# Patient Record
Sex: Male | Born: 1960 | Race: Black or African American | Hispanic: No | Marital: Single | Smoking: Never smoker
Health system: Southern US, Community
[De-identification: ages and names within clinical notes are randomized; demographics above are authoritative.]

## PROBLEM LIST (undated history)

## (undated) DIAGNOSIS — I10 Essential (primary) hypertension: Secondary | ICD-10-CM

---

## 2002-02-18 ENCOUNTER — Emergency Department (HOSPITAL_COMMUNITY): Admission: EM | Admit: 2002-02-18 | Discharge: 2002-02-18 | Payer: Self-pay | Admitting: *Deleted

## 2003-04-01 ENCOUNTER — Emergency Department (HOSPITAL_COMMUNITY): Admission: AD | Admit: 2003-04-01 | Discharge: 2003-04-01 | Payer: Self-pay | Admitting: Family Medicine

## 2003-10-29 ENCOUNTER — Emergency Department (HOSPITAL_COMMUNITY): Admission: EM | Admit: 2003-10-29 | Discharge: 2003-10-29 | Payer: Self-pay | Admitting: Internal Medicine

## 2004-06-07 ENCOUNTER — Emergency Department (HOSPITAL_COMMUNITY): Admission: EM | Admit: 2004-06-07 | Discharge: 2004-06-08 | Payer: Self-pay | Admitting: Emergency Medicine

## 2004-06-20 ENCOUNTER — Emergency Department (HOSPITAL_COMMUNITY): Admission: EM | Admit: 2004-06-20 | Discharge: 2004-06-20 | Payer: Self-pay | Admitting: Emergency Medicine

## 2009-03-30 ENCOUNTER — Emergency Department (HOSPITAL_COMMUNITY): Admission: EM | Admit: 2009-03-30 | Discharge: 2009-03-30 | Payer: Self-pay | Admitting: Family Medicine

## 2009-03-30 ENCOUNTER — Emergency Department (HOSPITAL_COMMUNITY): Admission: EM | Admit: 2009-03-30 | Discharge: 2009-03-31 | Payer: Self-pay | Admitting: Emergency Medicine

## 2010-04-20 ENCOUNTER — Inpatient Hospital Stay (INDEPENDENT_AMBULATORY_CARE_PROVIDER_SITE_OTHER)
Admission: RE | Admit: 2010-04-20 | Discharge: 2010-04-20 | Disposition: A | Discharge status: Home / Self Care | Payer: Self-pay | Source: Ambulatory Visit | Attending: Family Medicine | Admitting: Family Medicine

## 2010-04-20 DIAGNOSIS — I10 Essential (primary) hypertension: Secondary | ICD-10-CM

## 2010-04-20 DIAGNOSIS — E860 Dehydration: Secondary | ICD-10-CM

## 2010-04-20 DIAGNOSIS — K5289 Other specified noninfective gastroenteritis and colitis: Secondary | ICD-10-CM

## 2010-04-20 LAB — POCT I-STAT, CHEM 8
BUN: 23 mg/dL (ref 6–23)
Calcium, Ion: 1.04 mmol/L — ABNORMAL LOW (ref 1.12–1.32)
Chloride: 101 mEq/L (ref 96–112)
Creatinine, Ser: 1.8 mg/dL — ABNORMAL HIGH (ref 0.4–1.5)
Glucose, Bld: 116 mg/dL — ABNORMAL HIGH (ref 70–99)
HCT: 50 % (ref 39.0–52.0)
Hemoglobin: 17 g/dL (ref 13.0–17.0)
Potassium: 3.7 mEq/L (ref 3.5–5.1)
Sodium: 141 mEq/L (ref 135–145)
TCO2: 32 mmol/L (ref 0–100)

## 2010-04-20 LAB — DIFFERENTIAL
Basophils Absolute: 0 10*3/uL (ref 0.0–0.1)
Basophils Relative: 0 % (ref 0–1)
Eosinophils Absolute: 0 10*3/uL (ref 0.0–0.7)
Eosinophils Relative: 0 % (ref 0–5)
Lymphocytes Relative: 7 % — ABNORMAL LOW (ref 12–46)
Lymphs Abs: 0.5 10*3/uL — ABNORMAL LOW (ref 0.7–4.0)
Monocytes Absolute: 0.3 10*3/uL (ref 0.1–1.0)
Monocytes Relative: 4 % (ref 3–12)
Neutro Abs: 6.5 10*3/uL (ref 1.7–7.7)
Neutrophils Relative %: 89 % — ABNORMAL HIGH (ref 43–77)

## 2010-04-20 LAB — CBC
Hemoglobin: 16.2 g/dL (ref 13.0–17.0)
MCH: 28.5 pg (ref 26.0–34.0)
MCHC: 35.4 g/dL (ref 30.0–36.0)
Platelets: 125 10*3/uL — ABNORMAL LOW (ref 150–400)
RDW: 14.8 % (ref 11.5–15.5)

## 2010-05-27 LAB — DIFFERENTIAL
Eosinophils Absolute: 0.1 10*3/uL (ref 0.0–0.7)
Eosinophils Relative: 1 % (ref 0–5)
Lymphs Abs: 2.5 10*3/uL (ref 0.7–4.0)
Monocytes Relative: 7 % (ref 3–12)

## 2010-05-27 LAB — POCT I-STAT, CHEM 8
Creatinine, Ser: 2.1 mg/dL — ABNORMAL HIGH (ref 0.4–1.5)
HCT: 42 % (ref 39.0–52.0)
Hemoglobin: 14.3 g/dL (ref 13.0–17.0)
Sodium: 141 mEq/L (ref 135–145)
TCO2: 30 mmol/L (ref 0–100)

## 2010-05-27 LAB — URINALYSIS, ROUTINE W REFLEX MICROSCOPIC
Protein, ur: NEGATIVE mg/dL
Urobilinogen, UA: 0.2 mg/dL (ref 0.0–1.0)

## 2010-05-27 LAB — BASIC METABOLIC PANEL
Chloride: 101 mEq/L (ref 96–112)
GFR calc Af Amer: 42 mL/min — ABNORMAL LOW (ref >60)
Potassium: 2.9 mEq/L — ABNORMAL LOW (ref 3.5–5.1)

## 2010-05-27 LAB — CBC
HCT: 40.3 % (ref 39.0–52.0)
MCV: 84 fL (ref 78.0–100.0)
RBC: 4.8 MIL/uL (ref 4.22–5.81)
WBC: 6.3 10*3/uL (ref 4.0–10.5)

## 2010-05-27 LAB — URINE MICROSCOPIC-ADD ON

## 2015-05-28 ENCOUNTER — Encounter: Payer: Self-pay | Admitting: Emergency Medicine

## 2015-05-28 ENCOUNTER — Emergency Department
Admission: EM | Admit: 2015-05-28 | Discharge: 2015-05-28 | Disposition: A | Discharge status: Home / Self Care | Payer: Self-pay | Attending: Emergency Medicine | Admitting: Emergency Medicine

## 2015-05-28 ENCOUNTER — Emergency Department: Payer: Self-pay

## 2015-05-28 DIAGNOSIS — S8391XA Sprain of unspecified site of right knee, initial encounter: Secondary | ICD-10-CM

## 2015-05-28 DIAGNOSIS — S838X1A Sprain of other specified parts of right knee, initial encounter: Secondary | ICD-10-CM | POA: Insufficient documentation

## 2015-05-28 DIAGNOSIS — I10 Essential (primary) hypertension: Secondary | ICD-10-CM | POA: Insufficient documentation

## 2015-05-28 DIAGNOSIS — M25461 Effusion, right knee: Secondary | ICD-10-CM | POA: Insufficient documentation

## 2015-05-28 DIAGNOSIS — Y92511 Restaurant or cafe as the place of occurrence of the external cause: Secondary | ICD-10-CM | POA: Insufficient documentation

## 2015-05-28 DIAGNOSIS — Y999 Unspecified external cause status: Secondary | ICD-10-CM | POA: Insufficient documentation

## 2015-05-28 DIAGNOSIS — W010XXA Fall on same level from slipping, tripping and stumbling without subsequent striking against object, initial encounter: Secondary | ICD-10-CM | POA: Insufficient documentation

## 2015-05-28 DIAGNOSIS — Y939 Activity, unspecified: Secondary | ICD-10-CM | POA: Insufficient documentation

## 2015-05-28 HISTORY — DX: Essential (primary) hypertension: I10

## 2015-05-28 MED ORDER — KETOROLAC TROMETHAMINE 30 MG/ML IJ SOLN
30.0000 mg | Freq: Once | INTRAMUSCULAR | Status: AC
  Administered 2015-05-28: 30 mg via INTRAMUSCULAR
  Filled 2015-05-28: qty 1

## 2015-05-28 MED ORDER — ACETAMINOPHEN 500 MG PO TABS: 500.0000 mg | ORAL_TABLET | Freq: Four times a day (QID) | ORAL | Status: AC | PRN

## 2015-05-28 MED ORDER — PREDNISONE 20 MG PO TABS: ORAL_TABLET | ORAL | Status: DC

## 2015-05-28 NOTE — Discharge Instructions (Signed)
Cryotherapy Cryotherapy is when you put ice on your injury. Ice helps lessen pain and puffiness (swelling) after an injury. Ice works the best when you start using it in the first 24 to 48 hours after an injury. HOME CARE  Put a dry or damp towel between the ice pack and your skin.  You may press gently on the ice pack.  Leave the ice on for no more than 10 to 20 minutes at a time.  Check your skin after 5 minutes to make sure your skin is okay.  Rest at least 20 minutes between ice pack uses.  Stop using ice when your skin loses feeling (numbness).  Do not use ice on someone who cannot tell you when it hurts. This includes small children and people with memory problems (dementia). GET HELP RIGHT AWAY IF:  You have white spots on your skin.  Your skin turns blue or pale.  Your skin feels waxy or hard.  Your puffiness gets worse. MAKE SURE YOU:   Understand these instructions.  Will watch your condition.  Will get help right away if you are not doing well or get worse.   This information is not intended to replace advice given to you by your health care provider. Make sure you discuss any questions you have with your health care provider.   Document Released: 08/14/2007 Document Revised: 05/20/2011 Document Reviewed: 10/18/2010 Elsevier Interactive Patient Education 2016 Elsevier Inc.  Knee Effusion Knee effusion means that you have extra fluid in your knee. This can cause pain. Your knee may be more difficult to bend and move. HOME CARE  Use crutches as told by your doctor.  Wear a knee brace as told by your doctor.  Apply ice to the swollen area:  Put ice in a plastic bag.  Place a towel between your skin and the bag.  Leave the ice on for 20 minutes, 2-3 times per day.  Keep your knee raised (elevated) when you are sitting or lying down.  Take medicines only as told by your doctor.  Do any rehabilitation or strengthening exercises as told by your  doctor.  Rest your knee as told by your doctor. You may start doing your normal activities again when your doctor says it is okay.  Keep all follow-up visits as told by your doctor. This is important. GET HELP IF:   You continue to have pain in your knee. GET HELP RIGHT AWAY IF:  You have increased swelling or redness of your knee.  You have severe pain in your knee.  You have a fever.   This information is not intended to replace advice given to you by your health care provider. Make sure you discuss any questions you have with your health care provider.   Document Released: 03/30/2010 Document Revised: 03/18/2014 Document Reviewed: 10/11/2013 Elsevier Interactive Patient Education 2016 Elsevier Inc.  Knee Sprain A knee sprain is a tear in the strong bands of tissue that connect the bones (ligaments) of your knee. HOME CARE  Raise (elevate) your injured knee to lessen puffiness (swelling).  To ease pain and puffiness, put ice on the injured area.  Put ice in a plastic bag.  Place a towel between your skin and the bag.  Leave the ice on for 20 minutes, 2-3 times a day.  Only take medicine as told by your doctor.  Do not leave your knee unprotected until pain and stiffness go away (usually 4-6 weeks).  If you have a cast  or splint, do not get it wet. If your doctor told you to not take it off, cover it with a plastic bag when you shower or bathe. Do not swim.  Your doctor may have you do exercises to prevent or limit permanent weakness and stiffness. GET HELP RIGHT AWAY IF:   Your cast or splint becomes damaged.  Your pain gets worse.  You have a lot of pain, puffiness, or numbness below the cast or splint. MAKE SURE YOU:   Understand these instructions.  Will watch your condition.  Will get help right away if you are not doing well or get worse.   This information is not intended to replace advice given to you by your health care provider. Make sure you  discuss any questions you have with your health care provider.   Document Released: 02/13/2009 Document Revised: 03/02/2013 Document Reviewed: 11/03/2012 Elsevier Interactive Patient Education [employer] Elsevier Inc.

## 2015-05-28 NOTE — ED Provider Notes (Signed)
[facility] Emergency Department Provider Note  ____________________________________________  Time seen: Approximately 11:58 AM  I have reviewed the triage vital signs and the nursing notes.   HISTORY  Chief Complaint Knee Pain    HPI Tom Rogers is a 55 y.o. male , NAD, presents to the emergency department with 2 week history of right knee pain. He states that he slipped and fell in a restaurant one month ago. Did not have pain in his body until the knee pain began 2 weeks ago. Has medial knee pain that is worse with bending the knee. Does ease off once he stands and is ambulatory. Pain has continually increased over the last 2 weeks noting that now the pain is a 10 out of 10. Denies any numbness, weakness, tingling. Has had no swelling about the lower extremity and notes no edema. Denies chest pain, shortness breath, headache, visual changes. Has no lacerations, bruising or other skin sores to the area.   Past Medical History  Diagnosis Date  . Hypertension     There are no active problems to display for this patient.   History reviewed. No pertinent past surgical history.  Current Outpatient Rx  Name  Route  Sig  Dispense  Refill  . acetaminophen (TYLENOL) 500 MG tablet   Oral   Take 1 tablet (500 mg total) by mouth every 6 (six) hours as needed.   30 tablet   0   . predniSONE (DELTASONE) 20 MG tablet      Take 2 tablets by mouth, once daily, for 5 days   10 tablet   0     Allergies Review of patient's allergies indicates no known allergies.  No family history on file.  Social History Social History  Substance Use Topics  . Smoking status: Never Smoker   . Smokeless tobacco: None  . Alcohol Use: No     Review of Systems  Constitutional: No fever/chills Eyes: No visual changes.  Cardiovascular: No chest pain. Respiratory:  No shortness of breath. No wheezing.  Musculoskeletal: Positive right knee pain. Negative for hip,  ankle, foot pain Skin: Positive right knee swelling. Negative for rash, lacerations, redness, warmth. Neurological: Negative for headaches, focal weakness or numbness. 10-point ROS otherwise negative.  ____________________________________________   PHYSICAL EXAM:  VITAL SIGNS: ED Triage Vitals  Enc Vitals Group     BP 05/28/15 1052 174/119 mmHg     Pulse Rate 05/28/15 1052 83     Resp 05/28/15 1052 18     Temp 05/28/15 1052 98.6 F (37 C)     Temp Source 05/28/15 1052 Oral     SpO2 05/28/15 1052 97 %     Weight 05/28/15 1052 225 lb (102.059 kg)     Height 05/28/15 1052 6\' 1"  (1.854 m)     Head Cir --      Peak Flow --      Pain Score 05/28/15 1052 9     Pain Loc --      Pain Edu? --      Excl. in GC? --     Constitutional: Alert and oriented. Well appearing and in no acute distress. Eyes: Conjunctivae are normal.  Head: Atraumatic. Neck: Supple with full range of motion. Cardiovascular:  Good peripheral circulation with right lower extremity having 2+ pulses. Respiratory: Normal respiratory effort without tachypnea or retractions. Lungs CTAB. Gastrointestinal: Soft and nontender. No distention. No CVA tenderness. Musculoskeletal: Pain to palpation about the medial right knee. No pain to  manipulation or palpation of the patella. No laxity to anterior posterior drawer nor varus or valgus stress. No swelling or effusion to palpation. No lower extremity edema. Neurologic:  Normal speech and language. No gross focal neurologic deficits are appreciated.  Skin:  Skin is warm, dry and intact. No rash, redness, warmth, swelling noted. Psychiatric: Mood and affect are normal. Speech and behavior are normal. Patient exhibits appropriate insight and judgement.   ____________________________________________   LABS  None  ____________________________________________  EKG  None ____________________________________________  RADIOLOGY I have personally viewed and evaluated  these images (plain radiographs) as part of my medical decision making, as well as reviewing the written report by the radiologist.  Dg Knee Complete 4 Views Right  05/28/2015  CLINICAL DATA:  Pt states he slipped and fell one month ago and two weeks later started having pain to right knee. Pain Has worsened. Pain with weight-bearing. EXAM: RIGHT KNEE - COMPLETE 4+ VIEW COMPARISON:  None. FINDINGS: Moderate joint effusion is present. There is mild degenerative change involving the patellofemoral and medial compartments. No evidence for acute fracture or subluxation. IMPRESSION: 1. Joint effusion. 2. Degenerative changes. Electronically Signed   By: Norva Pavlov M.D.   On: 05/28/2015 12:43    ____________________________________________    PROCEDURES  Procedure(s) performed: None    Medications  ketorolac (TORADOL) 30 MG/ML injection 30 mg (30 mg Intramuscular Given 05/28/15 1252)     ____________________________________________   INITIAL IMPRESSION / ASSESSMENT AND PLAN / ED COURSE  Pertinent imaging results that were available during my care of the patient were reviewed by me and considered in my medical decision making (see chart for details).  Patient's diagnosis is consistent with right knee sprain with effusion. The patient's right knee placed in an Ace wrap and given crutches to ambulate. Patient will be discharged home with prescriptions for prednisone to take as directed. May take Tylenol as needed for pain. Patient is to follow up with Dr. Joice Lofts in orthopedics if not improving in 2-3 days. Patient is given ED precautions to return to the ED for any worsening or new symptoms.    ____________________________________________  FINAL CLINICAL IMPRESSION(S) / ED DIAGNOSES  Final diagnoses:  Right knee sprain, initial encounter  Knee effusion, right      NEW MEDICATIONS STARTED DURING THIS VISIT:  New Prescriptions   ACETAMINOPHEN (TYLENOL) 500 MG TABLET    Take 1  tablet (500 mg total) by mouth every 6 (six) hours as needed.   PREDNISONE (DELTASONE) 20 MG TABLET    Take 2 tablets by mouth, once daily, for 5 days         [name], PA-C 05/28/15 1255  Jene Every, MD 05/28/15 1447

## 2015-05-28 NOTE — ED Notes (Signed)
Pt states he slipped and fell one month ago and two weeks later started having pain to right knee that is getting worse.

## 2017-01-14 ENCOUNTER — Emergency Department (HOSPITAL_COMMUNITY): Payer: Self-pay

## 2017-01-14 ENCOUNTER — Encounter (HOSPITAL_COMMUNITY): Payer: Self-pay | Admitting: Emergency Medicine

## 2017-01-14 ENCOUNTER — Emergency Department (HOSPITAL_COMMUNITY)
Admission: EM | Admit: 2017-01-14 | Discharge: 2017-01-14 | Disposition: A | Discharge status: Home / Self Care | Payer: Self-pay | Attending: Emergency Medicine | Admitting: Emergency Medicine

## 2017-01-14 ENCOUNTER — Other Ambulatory Visit: Payer: Self-pay

## 2017-01-14 DIAGNOSIS — I10 Essential (primary) hypertension: Secondary | ICD-10-CM | POA: Insufficient documentation

## 2017-01-14 DIAGNOSIS — Z79899 Other long term (current) drug therapy: Secondary | ICD-10-CM | POA: Insufficient documentation

## 2017-01-14 DIAGNOSIS — R600 Localized edema: Secondary | ICD-10-CM | POA: Insufficient documentation

## 2017-01-14 DIAGNOSIS — M10272 Drug-induced gout, left ankle and foot: Secondary | ICD-10-CM | POA: Insufficient documentation

## 2017-01-14 MED ORDER — PREDNISONE 20 MG PO TABS
60.0000 mg | ORAL_TABLET | Freq: Once | ORAL | Status: AC
  Administered 2017-01-14: 60 mg via ORAL
  Filled 2017-01-14: qty 3

## 2017-01-14 MED ORDER — OXYCODONE-ACETAMINOPHEN 5-325 MG PO TABS: 2.0000 | ORAL_TABLET | Freq: Four times a day (QID) | ORAL | 0 refills | Status: AC | PRN

## 2017-01-14 MED ORDER — PREDNISONE 10 MG PO TABS: 40.0000 mg | ORAL_TABLET | Freq: Every day | ORAL | 0 refills | Status: AC

## 2017-01-14 MED ORDER — OXYCODONE-ACETAMINOPHEN 5-325 MG PO TABS
1.0000 | ORAL_TABLET | Freq: Once | ORAL | Status: AC
  Administered 2017-01-14: 1 via ORAL
  Filled 2017-01-14: qty 1

## 2017-01-14 MED ORDER — ACETAMINOPHEN 325 MG PO TABS
650.0000 mg | ORAL_TABLET | Freq: Once | ORAL | Status: AC
  Administered 2017-01-14: 650 mg via ORAL
  Filled 2017-01-14: qty 2

## 2017-01-14 NOTE — ED Provider Notes (Signed)
[facility]   CSN: [id number] Arrival date & time: 01/14/17  0847     History   Chief Complaint Chief Complaint  Patient presents with  . Foot Pain  . Gout    HPI Tom SangerObinna Quesnell is a 56 y.o. male with history of nonischemic cardiomyopathy, hypertension, on Lasix presents to ED for sudden onset, gradually worsening pain, redness, warmth to the left big toe MTP. Onset 2-3 days ago. Tylenol has not provided any pain relief. Pain is aggravated by weightbearing and direct palpation, minimal pain with range of motion. No trauma or history of previous surgeries. Denies history of diabetes or gout. No IV drug use.  HPI  Past Medical History:  Diagnosis Date  . Hypertension     There are no active problems to display for this patient.   History reviewed. No pertinent surgical history.     Home Medications    Prior to Admission medications   Medication Sig Start Date End Date Taking? Authorizing Provider  acetaminophen (TYLENOL) 500 MG tablet Take 1 tablet (500 mg total) by mouth every 6 (six) hours as needed. 05/28/15   Hagler, Jami L, PA-C  oxyCODONE-acetaminophen (PERCOCET/ROXICET) 5-325 MG tablet Take 2 tablets every 6 (six) hours as needed for up to 2 days by mouth for severe pain. 01/14/17 01/16/17  Liberty HandyGibbons, Jahmai Finelli J, PA-C  predniSONE (DELTASONE) 10 MG tablet Take 4 tablets (40 mg total) daily for 5 days by mouth. 01/14/17 01/19/17  Liberty HandyGibbons, Xue Low J, PA-C    Family History No family history on file.  Social History Social History   Tobacco Use  . Smoking status: Never Smoker  Substance Use Topics  . Alcohol use: No  . Drug use: No     Allergies   Patient has no known allergies.   Review of Systems Review of Systems  Constitutional: Negative for chills and fever.  Musculoskeletal: Positive for arthralgias and gait problem.  Skin: Negative for color change.     Physical Exam Updated Vital Signs BP  (!) 156/106 (BP Location: Right Arm)   Pulse 67   Temp 98 F (36.7 C) (Oral)   Resp 16   SpO2 100%   Physical Exam  Constitutional: He is oriented to person, place, and time. He appears well-developed and well-nourished. No distress.  NAD.  HENT:  Head: Normocephalic and atraumatic.  Right Ear: External ear normal.  Left Ear: External ear normal.  Nose: Nose normal.  Eyes: Conjunctivae and EOM are normal. No scleral icterus.  Neck: Normal range of motion. Neck supple.  Cardiovascular: Normal rate, regular rhythm, normal heart sounds and intact distal pulses.  No murmur heard. Pulmonary/Chest: Effort normal and breath sounds normal. He has no wheezes.  Musculoskeletal: Normal range of motion. He exhibits edema and tenderness. He exhibits no deformity.  Focal edema, erythema and warmth to medial aspect of left big toe MTP, mild pain with PROM and AROM.  Neurological: He is alert and oriented to person, place, and time.  Skin: Skin is warm and dry. Capillary refill takes less than 2 seconds.  Psychiatric: He has a normal mood and affect. His behavior is normal. Judgment and thought content normal.  Nursing note and vitals reviewed.    ED Treatments / Results  Labs (all labs ordered are listed, but only abnormal results are displayed) Labs Reviewed - No data to display  EKG  EKG Interpretation None       Radiology Dg Foot Complete Left  Result Date: 01/14/2017 CLINICAL DATA:  Five days of pain along the plantar surface of the great toe. Symptoms increase with pressure. No known injury. EXAM: LEFT FOOT - COMPLETE 3+ VIEW COMPARISON:  Left foot series of October 29, 2003 FINDINGS: The bones are subjectively adequately mineralized. There is a stable cortical defect along the distal medial aspect of the proximal phalanx of the great toe. The interphalangeal and first MTP joints are normal. The soft tissues on the lateral view are unremarkable. The other phalanges as well as the  metatarsals are intact. The sesamoid bones of the first MT PE joint region are stable in appearance. The intertarsal and tarsometatarsal joints appear normal. There is an Achilles region calcaneal spur. IMPRESSION: There is no acute bony abnormality of the left great toe. The overlying soft tissues are unremarkable as well. Electronically Signed   By: David  SwazilandJordan M.D.   On: 01/14/2017 10:08    Procedures Procedures (including critical care time)  Medications Ordered in ED Medications  oxyCODONE-acetaminophen (PERCOCET/ROXICET) 5-325 MG per tablet 1 tablet (1 tablet Oral Given 01/14/17 1225)  acetaminophen (TYLENOL) tablet 650 mg (650 mg Oral Given 01/14/17 1225)  predniSONE (DELTASONE) tablet 60 mg (60 mg Oral Given 01/14/17 1225)     Initial Impression / Assessment and Plan / ED Course  I have reviewed the triage vital signs and the nursing notes.  Pertinent labs & imaging results that were available during my care of the patient were reviewed by me and considered in my medical decision making (see chart for details).    Pt presents with monoarticular swelling and pain.  He is on lasix. Pt is afebrile. No preceding trauma. No IVDU. Triage x-ray negative. No previous trauma, no h/o DM, no fever.  Pt dc with tylenol and prednisone. Discussed that pt should respond to treatment with in 24 hour of begining treatment & likely resolve in 2-3 days. Patient gout diet info   Final Clinical Impressions(s) / ED Diagnoses   Final diagnoses:  Acute drug-induced gout involving toe of left foot    ED Discharge Orders        Ordered    predniSONE (DELTASONE) 10 MG tablet  Daily     01/14/17 1232    oxyCODONE-acetaminophen (PERCOCET/ROXICET) 5-325 MG tablet  Every 6 hours PRN     01/14/17 1232       Liberty HandyGibbons, Salvador Bigbee J, PA-C 01/14/17 1344    Melene PlanFloyd, Dan, DO 01/14/17 1430

## 2017-01-14 NOTE — Discharge Instructions (Signed)
Your x-ray was normal.   Given your history of lasix use and exam, I suspect your toe pain is from gout. Please read attached information on gout and low purine diet.   For mild/moderate and tolerable pain take 1000 mg tylenol every 8 hours.  Additionally, take prednisone as prescribed. If pain is severe, take percocet as prescribed.   Percocet is a narcotic pain medication. Some mild side effects are nausea, drowsiness, stomach discomfort and constipation.  You should take a stool softener and/or miralax if you take any narcotic/opioid medications to avoid constipation. Other side effects include risk of overdose, death, dependence and abuse. Do not consume alcohol, drive or use heavy machinery while taking this medication. Do not leave unattended around children. Take only for severe o break through pain not controlled with tylenol or ibuprofen. Flush any extra pills that are left over or that you do not use. The emergency department has a strict policy regarding prescription of narcotic medications. We are unable to refill this medication in the emergency department for chronic pain. Contact your primary care provider or specialist for chronic pain management and refill on narcotic medications.

## 2017-06-06 ENCOUNTER — Other Ambulatory Visit: Payer: Self-pay

## 2017-06-06 ENCOUNTER — Emergency Department (HOSPITAL_COMMUNITY)
Admission: EM | Admit: 2017-06-06 | Discharge: 2017-06-06 | Disposition: A | Discharge status: Home / Self Care | Payer: Self-pay | Attending: Emergency Medicine | Admitting: Emergency Medicine

## 2017-06-06 ENCOUNTER — Emergency Department (HOSPITAL_COMMUNITY): Payer: Self-pay

## 2017-06-06 ENCOUNTER — Encounter (HOSPITAL_COMMUNITY): Payer: Self-pay | Admitting: *Deleted

## 2017-06-06 DIAGNOSIS — M25572 Pain in left ankle and joints of left foot: Secondary | ICD-10-CM | POA: Insufficient documentation

## 2017-06-06 DIAGNOSIS — I10 Essential (primary) hypertension: Secondary | ICD-10-CM | POA: Insufficient documentation

## 2017-06-06 DIAGNOSIS — Y929 Unspecified place or not applicable: Secondary | ICD-10-CM | POA: Insufficient documentation

## 2017-06-06 DIAGNOSIS — Y9301 Activity, walking, marching and hiking: Secondary | ICD-10-CM | POA: Insufficient documentation

## 2017-06-06 DIAGNOSIS — Y999 Unspecified external cause status: Secondary | ICD-10-CM | POA: Insufficient documentation

## 2017-06-06 DIAGNOSIS — X509XXA Other and unspecified overexertion or strenuous movements or postures, initial encounter: Secondary | ICD-10-CM | POA: Insufficient documentation

## 2017-06-06 MED ORDER — HYDROCODONE-ACETAMINOPHEN 5-325 MG PO TABS
1.0000 | ORAL_TABLET | Freq: Once | ORAL | Status: AC
  Administered 2017-06-06: 1 via ORAL
  Filled 2017-06-06: qty 1

## 2017-06-06 NOTE — Progress Notes (Signed)
Orthopedic Tech Progress Note Patient Details:  Fatima SangerObinna Sudduth 11/01/1960 [id number]  Ortho Devices Type of Ortho Device: Ankle Air splint, Ace wrap Ortho Device/Splint Location: Left Ortho Device/Splint Interventions: Application, Adjustment   Post Interventions Patient Tolerated: Well, Ambulated well Instructions Provided: Adjustment of device, Care of device   Alvina ChouWilliams, Breckin Savannah C 06/06/2017, 9:24 PM

## 2017-06-06 NOTE — ED Notes (Signed)
Ortho paged. 

## 2017-06-06 NOTE — Discharge Instructions (Addendum)
Please take Ibuprofen (Advil, motrin) and Tylenol (acetaminophen) to relieve your pain.  You may take up to 600 MG (3 pills) of normal strength ibuprofen every 8 hours as needed.  In between doses of ibuprofen you make take tylenol, up to 1,000 mg (two extra strength pills).  Do not take more than 3,000 mg tylenol in a 24 hour period.  Please check all medication labels as many medications such as pain and cold medications may contain tylenol.  Do not drink alcohol while taking these medications.  Do not take other NSAID'S while taking ibuprofen (such as aleve or naproxen).  Please take ibuprofen with food to decrease stomach upset.  Today you received medications that may make you sleepy or impair your ability to make decisions.  For the next 24 hours please do not drive, operate heavy machinery, care for a small child with out another adult present, or perform any activities that may cause harm to you or someone else if you were to fall asleep or be impaired.   If you develop redness, fevers, chills, pain higher up in your leg or have any concerns please seek additional medical care and evaluation.

## 2017-06-06 NOTE — ED Triage Notes (Signed)
Pt in c/o L ankle pain, no swelling present, denies injury, pt ambulatory, denies calf swelling or pain, pt reports pain onset yesterday, A&O x4

## 2017-06-06 NOTE — ED Provider Notes (Signed)
[facility]   CSN: [id number] Arrival date & time: 06/06/17  1803     History   Chief Complaint Chief Complaint  Patient presents with  . Ankle Pain    HPI Tom Rogers is a 57 y.o. male who presents today for evaluation of left ankle pain.  He reports that yesterday when he was walking he remembers stepping down abnormally.  He reports mild swelling and pain over the medial and lateral sides.  He denies any fevers or chills.  He denies personal history of gout.    HPI  Past Medical History:  Diagnosis Date  . Hypertension     There are no active problems to display for this patient.   History reviewed. No pertinent surgical history.      Home Medications    Prior to Admission medications   Medication Sig Start Date End Date Taking? Authorizing Provider  acetaminophen (TYLENOL) 500 MG tablet Take 1 tablet (500 mg total) by mouth every 6 (six) hours as needed. 05/28/15   Hagler, Jami L, PA-C    Family History No family history on file.  Social History Social History   Tobacco Use  . Smoking status: Never Smoker  . Smokeless tobacco: Never Used  Substance Use Topics  . Alcohol use: No  . Drug use: No     Allergies   Patient has no known allergies.   Review of Systems Review of Systems  Constitutional: Negative for activity change, chills and fever.  HENT: Negative for congestion.   Musculoskeletal: Positive for arthralgias and joint swelling.  Skin: Negative for rash and wound.     Physical Exam Updated Vital Signs BP (!) 148/105 (BP Location: Right Arm)   Pulse 68   Temp 98.9 F (37.2 C) (Oral)   Resp 18   SpO2 100%   Physical Exam  Constitutional: He is oriented to person, place, and time. He appears well-developed and well-nourished.  HENT:  Head: Normocephalic and atraumatic.  Cardiovascular:  2+ DP/PT pulses.  Bilateral feet are warm and well perfused.  Musculoskeletal:  LLE:  Mild swelling over medial and lateral ankle.  Minimal bony tenderness.  Ankle is grossly stable to my examination.  No obvious crepitus or deformities.  Ankle is not red.  5/5 strength, minimal pain with range of motion.  Neurological: He is alert and oriented to person, place, and time.  Skin: Skin is warm and dry. He is not diaphoretic.  No obvious skin breaks or wounds over bilateral feet.  No redness or drainage on the L. Ankle.  Left ankle is not hot.    Nursing note and vitals reviewed.    ED Treatments / Results  Labs (all labs ordered are listed, but only abnormal results are displayed) Labs Reviewed - No data to display  EKG None  Radiology Dg Ankle Complete Left  Result Date: 06/06/2017 CLINICAL DATA:  Left ankle pain.  No swelling. EXAM: LEFT ANKLE COMPLETE - 3+ VIEW COMPARISON:  None. FINDINGS: There is no evidence of fracture, dislocation, or joint effusion. There is no evidence of arthropathy or other focal bone abnormality. There is mild soft tissue swelling around the ankle. IMPRESSION: No acute osseous injury of the left ankle. Electronically Signed   By: Elige KoHetal  Patel   On: 06/06/2017 19:26    Procedures Procedures (including critical care time)  Medications Ordered in ED Medications  HYDROcodone-acetaminophen (NORCO/VICODIN) 5-325 MG per tablet 1 tablet (1 tablet Oral Given 06/06/17 2054)  Initial Impression / Assessment and Plan / ED Course  I have reviewed the triage vital signs and the nursing notes.  Pertinent labs & imaging results that were available during my care of the patient were reviewed by me and considered in my medical decision making (see chart for details).    Tom Rogers Presents with left ankle pain after stepping down "funny" on his left leg consistent with an ankle sprain/strain.  The affected ankle has mild edema and is tender on the medial and lateral aspect aspect.  X-rays were obtained with out acute abnormality. The skin is intact  to ankle/foot.  The foot is warm and well perfused with intact sensation.  Minimal pain with passive ROM, the ankle itself is not warm, red, I do not suspect septic arthritis.  Patient given instructions for OTC pain medication, was offered crutches and declined.  He was given ace wrap and brace.  Patient advised to follow up with PCP if symptoms persist for longer than one week.  Patient was given the option to ask questions, all of which were answered to the best of my ability.  Patient is agreeable for discharge.    Final Clinical Impressions(s) / ED Diagnoses   Final diagnoses:  Acute left ankle pain    ED Discharge Orders    None       Norman Clay 06/07/17 2008    Floyd, Dan, DO 06/10/17 1505

## 2018-11-28 IMAGING — CR DG ANKLE COMPLETE 3+V*L*
3 series · 3 of 3 positions shown · non-contrast
Comparison: None.

CLINICAL DATA: Left ankle pain.  No swelling.

EXAM:
LEFT ANKLE COMPLETE - 3+ VIEW

[ankle ap]
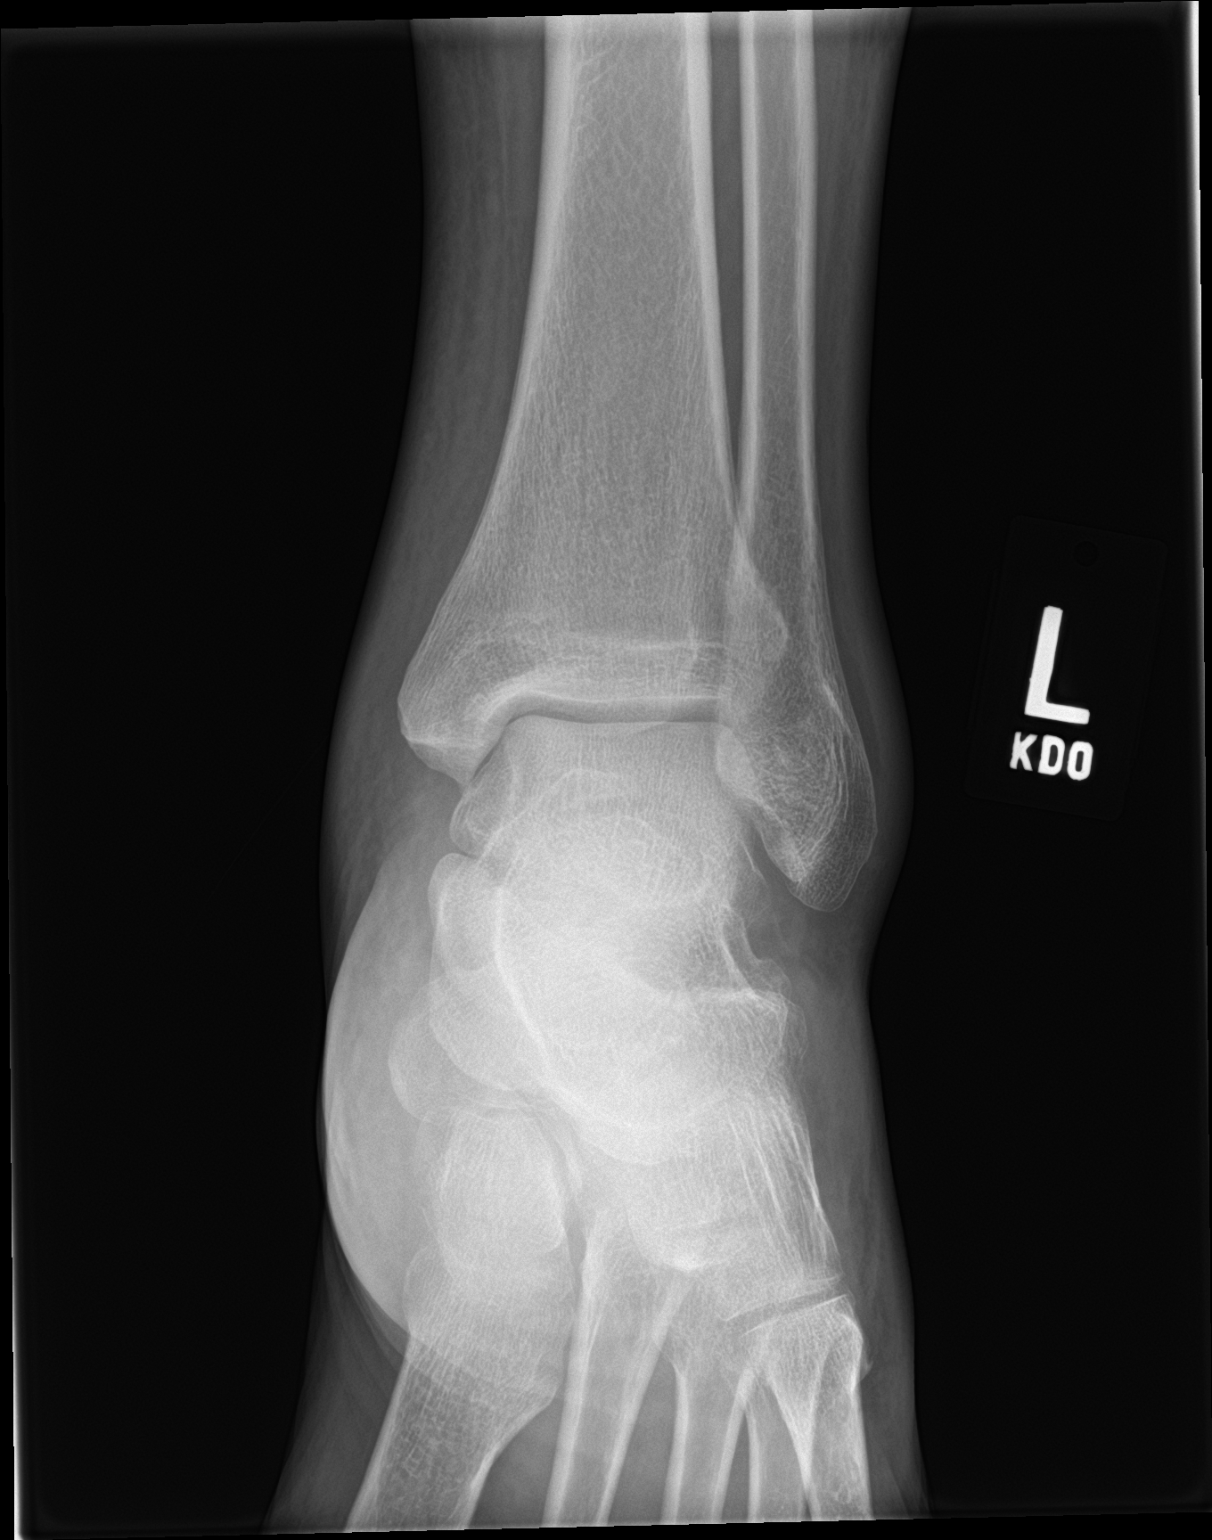

[ankle obl]
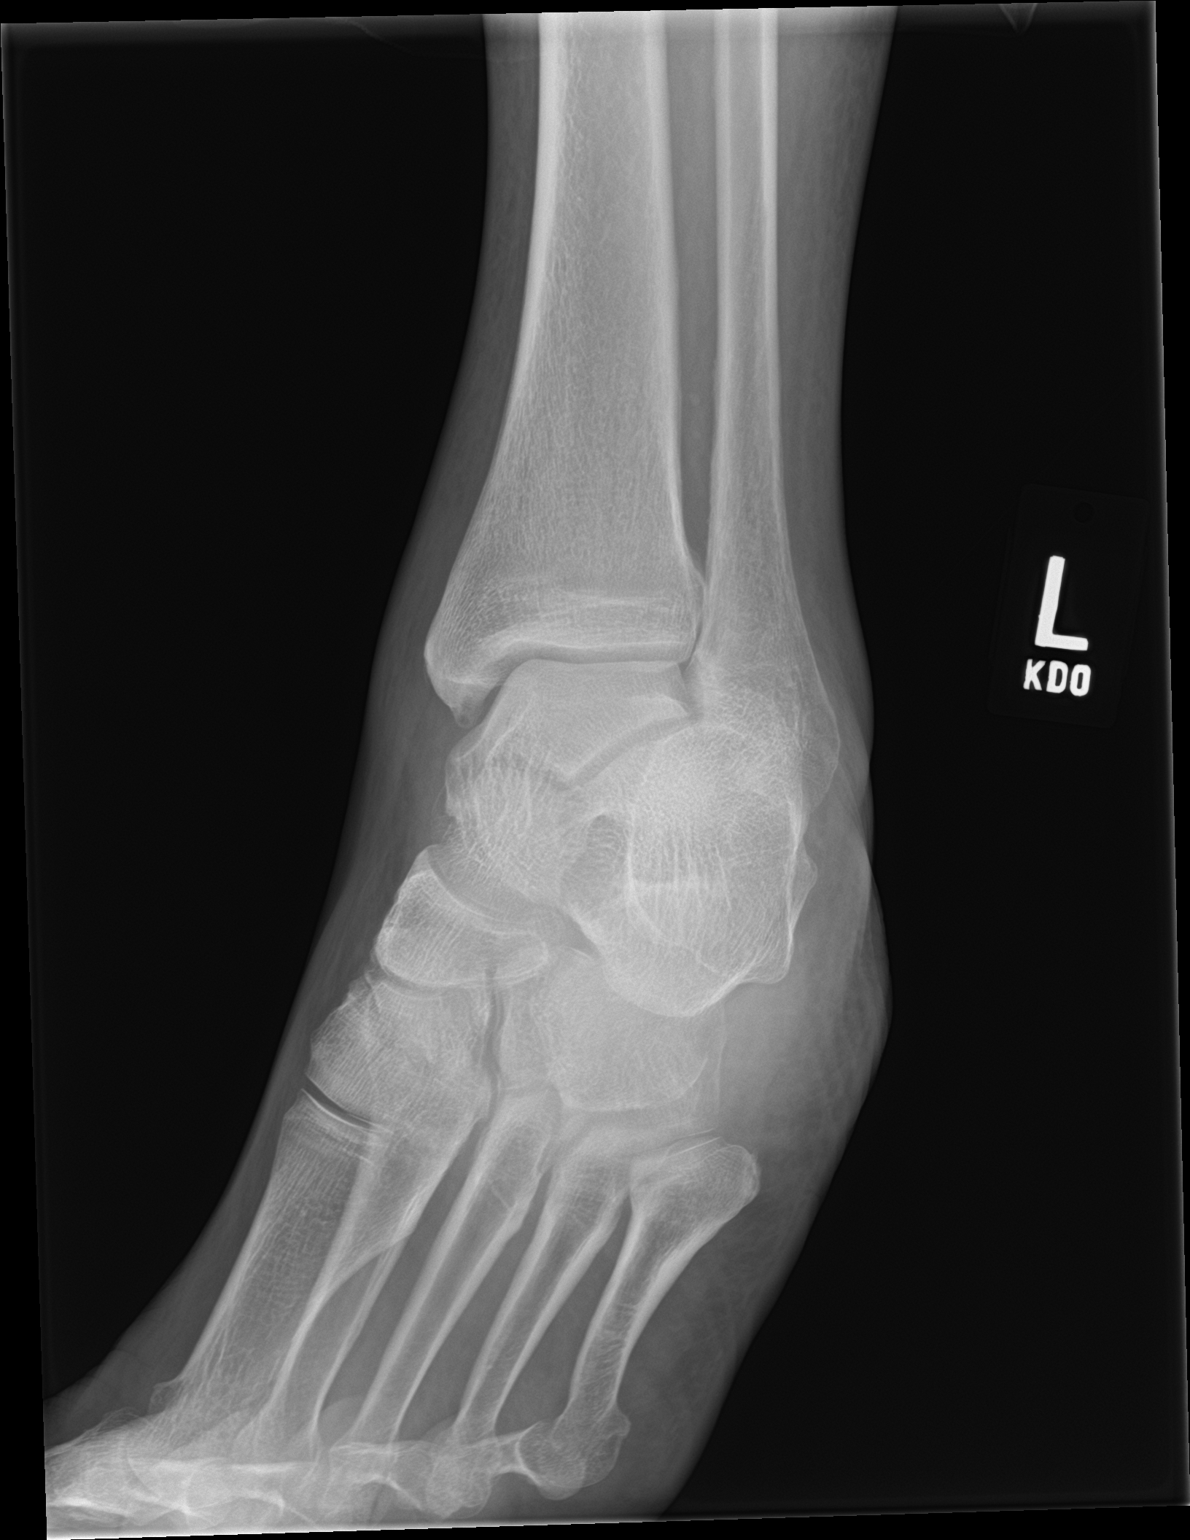

[ankle lat]
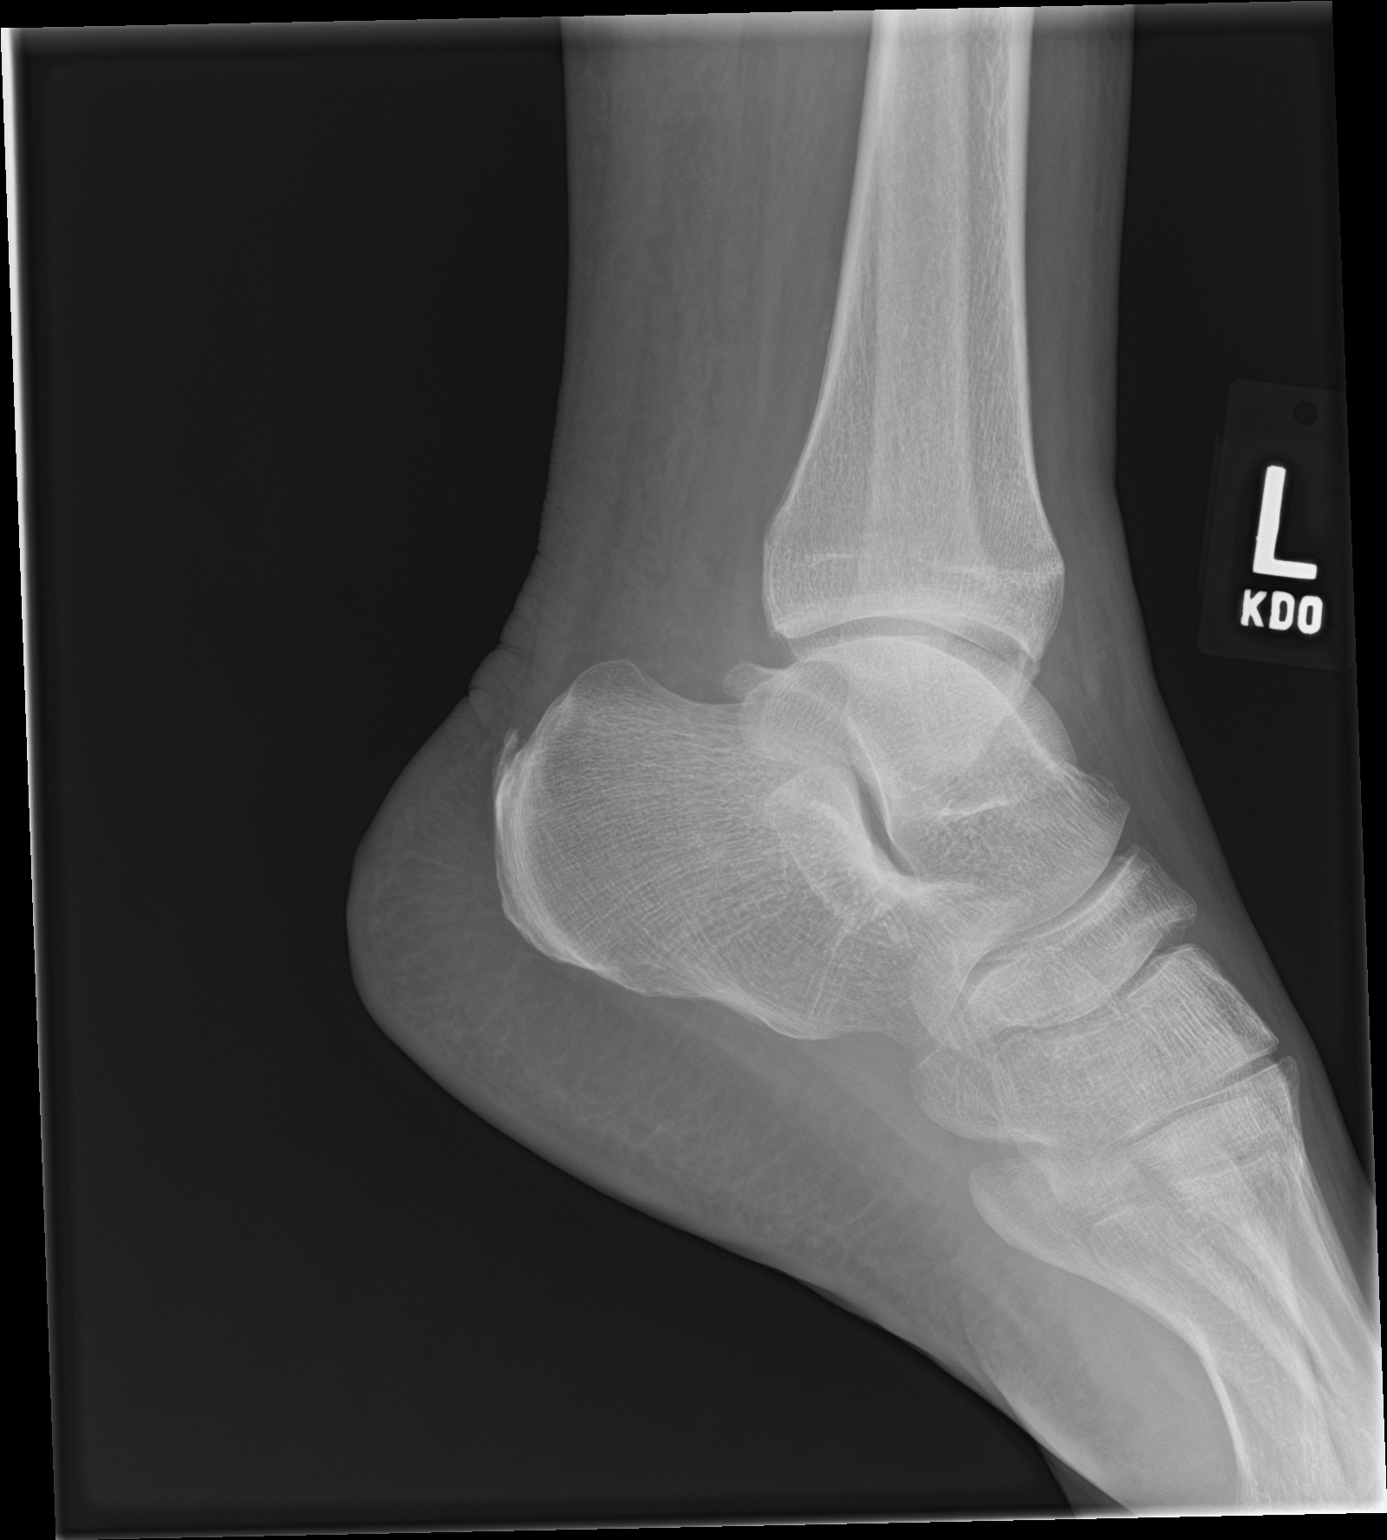

[3 of 3 positions shown; findings below may reference images not displayed]

FINDINGS: There is no evidence of fracture, dislocation, or joint effusion.
There is no evidence of arthropathy or other focal bone abnormality.
There is mild soft tissue swelling around the ankle.
IMPRESSION: No acute osseous injury of the left ankle.

## 2020-03-14 ENCOUNTER — Other Ambulatory Visit: Payer: Self-pay

## 2020-03-14 ENCOUNTER — Emergency Department (HOSPITAL_COMMUNITY): Payer: Self-pay

## 2020-03-14 ENCOUNTER — Emergency Department (HOSPITAL_COMMUNITY)
Admission: EM | Admit: 2020-03-14 | Discharge: 2020-03-14 | Disposition: A | Discharge status: Home / Self Care | Payer: Self-pay | Attending: Emergency Medicine | Admitting: Emergency Medicine

## 2020-03-14 DIAGNOSIS — M10072 Idiopathic gout, left ankle and foot: Secondary | ICD-10-CM | POA: Insufficient documentation

## 2020-03-14 DIAGNOSIS — I10 Essential (primary) hypertension: Secondary | ICD-10-CM | POA: Insufficient documentation

## 2020-03-14 DIAGNOSIS — M109 Gout, unspecified: Secondary | ICD-10-CM

## 2020-03-14 MED ORDER — INDOMETHACIN 25 MG PO CAPS
50.0000 mg | ORAL_CAPSULE | Freq: Once | ORAL | Status: AC
  Administered 2020-03-14: 50 mg via ORAL
  Filled 2020-03-14 (×2): qty 2

## 2020-03-14 MED ORDER — INDOMETHACIN 50 MG PO CAPS: 50.0000 mg | ORAL_CAPSULE | Freq: Three times a day (TID) | ORAL | 0 refills | Status: AC

## 2020-03-14 NOTE — ED Triage Notes (Signed)
C/o left ankle swelling + pain when bearing weight, since Friday. Denies injury.

## 2020-03-14 NOTE — ED Notes (Signed)
DC instructions reviewed with patient and patient verbalized understanding. Patient dc with wheelchair d/t pain in foot. Patient bp elevated, pt stated to he has not taken his blood pressure medication today but would take them when he got home.

## 2020-03-14 NOTE — Discharge Instructions (Signed)
You came to the emergency department today to have your left ankle pain evaluated.  The x-ray that was performed showed no fracture dislocation.  With your history of gout and your physical exam likely that gout is causing your current pain and swelling.  I have given you a dose of indomethacin which is a NSAID medication while you are in the emergency department.  I have also given you a prescription for this medication may take this medication 3 times a day with meals.  Please read the attached paperwork for more information on indomethacin.    You may also take tylenol, up to 1,000 mg (two extra strength pills) every 8 hours as needed.  It is safe to take ibuprofen and tylenol at the same time as they work differently.   Do not take more than 3,000 mg tylenol in a 24 hour period (not more than one dose every 8 hours.  Please check all medication labels as many medications such as pain and cold medications may contain tylenol.  Do not drink alcohol while taking these medications.  Do not take other NSAID'S while taking ibuprofen (such as aleve or naproxen).  Please take ibuprofen with food to decrease stomach upset.   Please follow-up with your primary care provider for further management of gout and if your symptoms do not improve.    Please return to the emergency department if:  Have severe or uncontrolled pain. Cannot urinate.

## 2020-03-14 NOTE — ED Provider Notes (Signed)
[facility] EMERGENCY DEPARTMENT Provider Note   CSN: [id number] Arrival date & time: 03/14/20  1139     History Chief Complaint  Patient presents with  . Joint Swelling    Tom Rogers is a 60 y.o. male with a history of hypertension and gout per patient.  Patient presents with a chief complaint of left ankle pain and swelling.  He reports his symptoms began on Friday and have gotten progressively worse since then.  Patient reports pain is worse with touch or weightbearing and with either he rates his pain as a 10/10.  When patient is off his feet he denies any pain.  Patient reports the pain is similar to pain he has experienced with gout in his right foot.  Patient denies any injury.    Patient denies any chest pain, shortness of breath, hemoptysis, cough, fevers, chills.    Marland Kitchen    HPI     Past Medical History:  Diagnosis Date  . Hypertension     There are no problems to display for this patient.   No past surgical history on file.     No family history on file.  Social History   Tobacco Use  . Smoking status: Never Smoker  . Smokeless tobacco: Never Used  Substance Use Topics  . Alcohol use: No  . Drug use: No    Home Medications Prior to Admission medications   Medication Sig Start Date End Date Taking? Authorizing Provider  indomethacin (INDOCIN) 50 MG capsule Take 1 capsule (50 mg total) by mouth 3 (three) times daily with meals for 3 days. 03/14/20 03/17/20 Yes Jahnavi Muratore, Rose Phi, PA-C  acetaminophen (TYLENOL) 500 MG tablet Take 1 tablet (500 mg total) by mouth every 6 (six) hours as needed. 05/28/15   Hagler, Jami L, PA-C    Allergies    Patient has no known allergies.  Review of Systems   Review of Systems  Constitutional: Negative for chills and fever.  Respiratory: Negative for cough and shortness of breath.   Cardiovascular: Negative for chest pain and palpitations.  Musculoskeletal: Positive for arthralgias (left ankle) and  joint swelling.    Physical Exam Updated Vital Signs BP (S) (!) 142/98 Comment: patient stated he hasnt taken blood pressure medication today and stated will take when he gets home.  Pulse 79   Temp 97.7 F (36.5 C) (Oral)   Resp 16   Ht 6\' 1"  (1.854 m)   Wt 106.1 kg   SpO2 100%   BMI 30.87 kg/m   Physical Exam Vitals and nursing note reviewed.  Constitutional:      General: He is not in acute distress.    Appearance: He is not ill-appearing, toxic-appearing or diaphoretic.  HENT:     Head: Normocephalic.  Eyes:     General: No scleral icterus.       Right eye: No discharge.        Left eye: No discharge.  Cardiovascular:     Rate and Rhythm: Normal rate.     Pulses:          Dorsalis pedis pulses are 3+ on the right side and 3+ on the left side.       Posterior tibial pulses are 3+ on the right side and 3+ on the left side.  Pulmonary:     Effort: Pulmonary effort is normal.  Musculoskeletal:     Right lower leg: No swelling, deformity, tenderness or bony tenderness. No edema.  Left lower leg: No swelling, deformity, tenderness or bony tenderness. No edema.     Right ankle: No swelling, deformity, ecchymosis or lacerations. No tenderness. Normal range of motion.     Right Achilles Tendon: No tenderness or defects.     Left ankle: Swelling present. No deformity, ecchymosis or lacerations. Tenderness present over the medial malleolus. Normal range of motion.     Left Achilles Tendon: No tenderness or defects.     Right foot: Normal capillary refill. No swelling, deformity, tenderness or bony tenderness. Normal pulse.     Left foot: Normal range of motion and normal capillary refill. Swelling present. No deformity, tenderness or bony tenderness. Normal pulse.     Comments: Swelling confined to left ankle and left foot, tenderness to medial malleolus, erythema observed to left medial ankle  Right calf circumference: 39.4cm Left calf circumference: 39.3cm  Skin:     General: Skin is warm and dry.  Neurological:     General: No focal deficit present.     Mental Status: He is alert.  Psychiatric:        Behavior: Behavior is cooperative.     ED Results / Procedures / Treatments   Labs (all labs ordered are listed, but only abnormal results are displayed) Labs Reviewed - No data to display  EKG None  Radiology DG Ankle 2 Views Left  Result Date: 03/14/2020 CLINICAL DATA:  Pain and swelling, no known injury, initial encounter EXAM: LEFT ANKLE - 2 VIEW COMPARISON:  None. FINDINGS: Mild generalized swelling is noted about the ankle joint. Small calcaneal spurs are seen. No acute fracture or dislocation is IMPRESSION: Mild soft tissue swelling without acute bony abnormality. Electronically Signed   By: Alcide Clever M.D.   On: 03/14/2020 12:52    Procedures Procedures (including critical care time)  Medications Ordered in ED Medications  indomethacin (INDOCIN) capsule 50 mg (50 mg Oral Given 03/14/20 2027)    ED Course  I have reviewed the triage vital signs and the nursing notes.  Pertinent labs & imaging results that were available during my care of the patient were reviewed by me and considered in my medical decision making (see chart for details).    MDM Rules/Calculators/A&P                          Alert 60 year old male in no acute distress, nontoxic appearing.  Presents with chief complaint of left ankle swelling and pain.  Patient denies any injury.  Swelling was observed to left ankle and left foot, edema was noted to medial left ankle, tenderness to medial left ankle.  No swelling or tenderness observed to patient's calves bilaterally; denies any active cancer treatment, no recent surgery or immobilization, no history of DVT or PE, no shortness of breath of breath, hemoptysis or tachycardia.  Less concerning for DVT.  Patient reports pain is similar to previous gout flare he has had in right foot.  Patient given indomethacin prescription  and told to follow-up with his primary care provider for gout management.  Discussed results, findings, treatment and follow up. Patient advised of return precautions. Patient verbalized understanding and agreed with plan.    Final Clinical Impression(s) / ED Diagnoses Final diagnoses:  Acute gout of left ankle, unspecified cause    Rx / DC Orders ED Discharge Orders         Ordered    indomethacin (INDOCIN) 50 MG capsule  3 times daily with meals  03/14/20 1955           Loni Beckwith, PA-C 03/14/20 2052    Blanchie Dessert, MD 03/15/20 [phone]

## 2020-03-17 ENCOUNTER — Other Ambulatory Visit: Payer: Self-pay

## 2020-03-17 ENCOUNTER — Encounter (HOSPITAL_COMMUNITY): Payer: Self-pay

## 2020-03-17 ENCOUNTER — Ambulatory Visit (HOSPITAL_COMMUNITY)
Admission: EM | Admit: 2020-03-17 | Discharge: 2020-03-17 | Disposition: A | Discharge status: Home / Self Care | Payer: Self-pay

## 2020-03-17 DIAGNOSIS — Z8739 Personal history of other diseases of the musculoskeletal system and connective tissue: Secondary | ICD-10-CM

## 2020-03-17 DIAGNOSIS — I509 Heart failure, unspecified: Secondary | ICD-10-CM

## 2020-03-17 DIAGNOSIS — M25472 Effusion, left ankle: Secondary | ICD-10-CM

## 2020-03-17 MED ORDER — PREDNISONE 10 MG PO TABS: 30.0000 mg | ORAL_TABLET | Freq: Every day | ORAL | 0 refills | Status: DC

## 2020-03-17 NOTE — ED Triage Notes (Signed)
Pt in with c/o left ankle swelling that has been going on since Friday.  Was seen in ED on 1/4. Xray of ankle showed tissue swelling and acute bone abnormality.  Pt has been taking prescribed indomethacin with no relief states the pain has not changed and is worse

## 2020-03-17 NOTE — Discharge Instructions (Signed)
Please stop taking indomethacin. We will use a lower dose of prednisone of 30mg  once daily. Please make sure you keep your appointment with your heart doctor on Monday. If you develop chest pain, shortness of breath then please report to the emergency room.

## 2020-03-17 NOTE — ED Provider Notes (Signed)
[facility]   MRN: [id number] DOB: Nov 17, 1960  Subjective:   Tom Rogers is a 60 y.o. male presenting for 1 week history of persistent and worsening left ankle swelling.  Has a history of gout and thinks that this is what it is.  Has previously had an right foot and ankle.  Reports that he responded very well to oxycodone and prednisone course.  Patient has had an echocardiogram done at the beginning of December 2021, showed an ejection fraction of 15 to 20%.  Has another follow-up appointment with his cardiologist in 3 days on Monday.  He was last seen at the ER 3 days ago, had a negative x-ray.  He was started on indomethacin which has not helped, swelling is not improved.  Denies history of kidney disease, any active chest pain, shortness of breath.  No current facility-administered medications for this encounter.  Current Outpatient Medications:  .  furosemide (LASIX) 40 MG tablet, Take 1 tablet by mouth daily., Disp: , Rfl:  .  lisinopril (ZESTRIL) 20 MG tablet, Take 1 tablet by mouth daily., Disp: , Rfl:  .  spironolactone (ALDACTONE) 25 MG tablet, Take by mouth., Disp: , Rfl:  .  acetaminophen (TYLENOL) 500 MG tablet, Take 1 tablet (500 mg total) by mouth every 6 (six) hours as needed., Disp: 30 tablet, Rfl: 0 .  amLODipine (NORVASC) 10 MG tablet, Take 10 mg by mouth daily., Disp: , Rfl:  .  carvedilol (COREG) 25 MG tablet, Take 25 mg by mouth 2 (two) times daily., Disp: , Rfl:  .  indomethacin (INDOCIN) 50 MG capsule, Take 1 capsule (50 mg total) by mouth 3 (three) times daily with meals for 3 days., Disp: 9 capsule, Rfl: 0   No Known Allergies  Past Medical History:  Diagnosis Date  . Hypertension      History reviewed. No pertinent surgical history.  History reviewed. No pertinent family history.  Social History   Tobacco Use  . Smoking status: Never Smoker  . Smokeless tobacco: Never Used  Substance Use Topics  . Alcohol use: No  . Drug use: No     ROS   Objective:   Vitals: BP 131/87   Pulse 88   Temp 97.8 F (36.6 C)   Resp 20   SpO2 100%   Physical Exam Constitutional:      General: He is not in acute distress.    Appearance: Normal appearance. He is well-developed. He is not ill-appearing, toxic-appearing or diaphoretic.  HENT:     Head: Normocephalic and atraumatic.     Right Ear: External ear normal.     Left Ear: External ear normal.     Nose: Nose normal.     Mouth/Throat:     Mouth: Mucous membranes are moist.     Pharynx: Oropharynx is clear.  Eyes:     General: No scleral icterus.    Extraocular Movements: Extraocular movements intact.     Pupils: Pupils are equal, round, and reactive to light.  Cardiovascular:     Rate and Rhythm: Normal rate and regular rhythm.     Heart sounds: Normal heart sounds. No murmur heard. No friction rub. No gallop.   Pulmonary:     Effort: Pulmonary effort is normal. No respiratory distress.     Breath sounds: Normal breath sounds. No stridor. No wheezing, rhonchi or rales.  Musculoskeletal:     Left ankle: Swelling present. No deformity, ecchymosis or lacerations. Tenderness present over the lateral  malleolus and medial malleolus. Decreased range of motion.     Left Achilles Tendon: No tenderness or defects. Thompson's test negative.     Left foot: Normal range of motion and normal capillary refill. No swelling, deformity, tenderness, bony tenderness or crepitus.  Neurological:     Mental Status: He is alert and oriented to person, place, and time.  Psychiatric:        Mood and Affect: Mood normal.        Behavior: Behavior normal.        Thought Content: Thought content normal.      Assessment and Plan :   PDMP not reviewed this encounter.  1. Left ankle swelling   2. Congestive heart failure, unspecified HF chronicity, unspecified heart failure type (HCC)   3. History of gout     Patient is being weaned off of amlodipine.  He still takes Lasix.  As a  diuretic, I counseled that it is possible at this is one of the sources of his monoarticular joint swelling and pain.  Discussed that given his heart failure it would be risky to use prednisone but patient really wants to try this.  He contracts for safety and reports that if he has any chest pain, shortness of breath he will report to the emergency room immediately.  Recommend he stop NSAID use.  Maintain follow-up appointment on Monday. Counseled patient on potential for adverse effects with medications prescribed/recommended today, ER and return-to-clinic precautions discussed, patient verbalized understanding.    Wallis Bamberg, [place] 03/17/20 [phone]

## 2020-03-26 ENCOUNTER — Other Ambulatory Visit: Payer: Self-pay

## 2020-03-26 ENCOUNTER — Encounter (HOSPITAL_COMMUNITY): Payer: Self-pay | Admitting: Obstetrics and Gynecology

## 2020-03-26 ENCOUNTER — Emergency Department (HOSPITAL_COMMUNITY)
Admission: EM | Admit: 2020-03-26 | Discharge: 2020-03-26 | Disposition: A | Discharge status: Home / Self Care | Payer: Self-pay | Attending: Emergency Medicine | Admitting: Emergency Medicine

## 2020-03-26 DIAGNOSIS — M109 Gout, unspecified: Secondary | ICD-10-CM | POA: Insufficient documentation

## 2020-03-26 DIAGNOSIS — I10 Essential (primary) hypertension: Secondary | ICD-10-CM | POA: Insufficient documentation

## 2020-03-26 DIAGNOSIS — Z79899 Other long term (current) drug therapy: Secondary | ICD-10-CM | POA: Insufficient documentation

## 2020-03-26 MED ORDER — OXYCODONE-ACETAMINOPHEN 5-325 MG PO TABS: 1.0000 | ORAL_TABLET | Freq: Four times a day (QID) | ORAL | 0 refills | Status: AC | PRN

## 2020-03-26 MED ORDER — PREDNISONE 20 MG PO TABS
60.0000 mg | ORAL_TABLET | Freq: Once | ORAL | Status: AC
  Administered 2020-03-26: 60 mg via ORAL
  Filled 2020-03-26: qty 3

## 2020-03-26 MED ORDER — PREDNISONE 10 MG PO TABS: ORAL_TABLET | ORAL | 0 refills | Status: AC

## 2020-03-26 MED ORDER — OXYCODONE-ACETAMINOPHEN 5-325 MG PO TABS
1.0000 | ORAL_TABLET | Freq: Once | ORAL | Status: AC
  Administered 2020-03-26: 1 via ORAL
  Filled 2020-03-26: qty 1

## 2020-03-26 NOTE — ED Triage Notes (Signed)
Patient presents to the ER for ankle pain. Patient was seen for same on 1/4 and 1/7. Patient reportedly was taking indomethacin and reports the pain is worsening. Reports inability to put pressure on ankle. Obvious swelling noted to area.

## 2020-03-26 NOTE — Progress Notes (Signed)
Orthopedic Tech Progress Note Patient Details:  Tom Rogers 07/28/1960 [id number]  Ortho Devices Ortho Device/Splint Location: applied cam walker to LLE Ortho Device/Splint Interventions: Ordered,Application   Post Interventions Patient Tolerated: Well Instructions Provided: Care of device   Jennye Moccasin 03/26/2020, 1:13 PM

## 2020-03-26 NOTE — ED Provider Notes (Signed)
[facility]-EMERGENCY DEPT Provider Note   CSN: [id number] Arrival date & time: 03/26/20  1053     History Chief Complaint  Patient presents with  . Ankle Pain    Tom Rogers is a 60 y.o. male.  HPI He reports pain in his posterior heel, left, with secondary swelling of his ankle, for 3 weeks.  He has been treated first with indomethacin, then prednisone with partial relief mostly with the prednisone.  He has run out of the prednisone, and now the swelling and pain has worsened.  He has history of same when he had gout, several years ago.  He is not taking medications to suppress gout.  He denies fever, chills, nausea, vomiting.  He came here by private vehicle for evaluation.  There are no other known modifying factors.    Past Medical History:  Diagnosis Date  . Hypertension     There are no problems to display for this patient.   History reviewed. No pertinent surgical history.     No family history on file.  Social History   Tobacco Use  . Smoking status: Never Smoker  . Smokeless tobacco: Never Used  Substance Use Topics  . Alcohol use: No  . Drug use: No    Home Medications Prior to Admission medications   Medication Sig Start Date End Date Taking? Authorizing Provider  oxyCODONE-acetaminophen (PERCOCET) 5-325 MG tablet Take 1 tablet by mouth every 6 (six) hours as needed for moderate pain or severe pain. 03/26/20  Yes Mancel Bale, MD  predniSONE (DELTASONE) 10 MG tablet Take q day 6,5,4,3,2,1 03/26/20  Yes Mancel Bale, MD  acetaminophen (TYLENOL) 500 MG tablet Take 1 tablet (500 mg total) by mouth every 6 (six) hours as needed. 05/28/15   Hagler, Jami L, PA-C  amLODipine (NORVASC) 10 MG tablet Take 10 mg by mouth daily. 02/01/20   [provider]  carvedilol (COREG) 25 MG tablet Take 25 mg by mouth 2 (two) times daily. 02/04/20   [provider]  furosemide (LASIX) 40 MG tablet Take 1 tablet by mouth daily.  02/01/20   [provider]  lisinopril (ZESTRIL) 20 MG tablet Take 1 tablet by mouth daily. 02/06/20   [provider]  spironolactone (ALDACTONE) 25 MG tablet Take by mouth. 02/09/20   [provider]    Allergies    Patient has no known allergies.  Review of Systems   Review of Systems  All other systems reviewed and are negative.   Physical Exam Updated Vital Signs BP (!) 147/89 (BP Location: Left Arm)   Pulse 94   Temp 97.6 F (36.4 C) (Oral)   Resp 16   SpO2 98%   Physical Exam Vitals and nursing note reviewed.  Constitutional:      Appearance: He is well-developed and well-nourished.  HENT:     Head: Normocephalic and atraumatic.     Right Ear: External ear normal.     Left Ear: External ear normal.  Eyes:     Extraocular Movements: EOM normal.     Conjunctiva/sclera: Conjunctivae normal.     Pupils: Pupils are equal, round, and reactive to light.  Neck:     Trachea: Phonation normal.  Cardiovascular:     Rate and Rhythm: Normal rate.  Pulmonary:     Effort: Pulmonary effort is normal.  Chest:     Chest wall: No bony tenderness.  Abdominal:     General: There is no distension.  Musculoskeletal:  Cervical back: Normal range of motion and neck supple.     Comments: Left ankle swollen but not tender.  Tenderness left posterior os calcis region, without deformity or abnormal Achilles tendon function.  No overlying erythema about the ankle or heel regions.  Skin:    General: Skin is warm, dry and intact.  Neurological:     Mental Status: He is alert and oriented to person, place, and time.     Cranial Nerves: No cranial nerve deficit.     Sensory: No sensory deficit.     Motor: No abnormal muscle tone.     Coordination: Coordination normal.  Psychiatric:        Mood and Affect: Mood and affect and mood normal.        Behavior: Behavior normal.        Thought Content: Thought content normal.        Judgment: Judgment normal.      ED Results / Procedures / Treatments   Labs (all labs ordered are listed, but only abnormal results are displayed) Labs Reviewed - No data to display  EKG None  Radiology No results found.  Procedures Procedures (including critical care time)  Medications Ordered in ED Medications  oxyCODONE-acetaminophen (PERCOCET/ROXICET) 5-325 MG per tablet 1 tablet (has no administration in time range)  predniSONE (DELTASONE) tablet 60 mg (has no administration in time range)    ED Course  I have reviewed the triage vital signs and the nursing notes.  Pertinent labs & imaging results that were available during my care of the patient were reviewed by me and considered in my medical decision making (see chart for details).    MDM Rules/Calculators/A&P                           Patient Vitals for the past 24 hrs:  BP Temp Temp src Pulse Resp SpO2  03/26/20 1102 (!) 147/89 97.6 F (36.4 C) Oral 94 16 98 %    12:49 PM Reevaluation with update and discussion. After initial assessment and treatment, an updated evaluation reveals no change in status, findings discussed and questions answered. Mancel Bale   Medical Decision Making:  This patient is presenting for evaluation of pain in the left heel and swelling left ankle, which does require a range of treatment options, and is a complaint that involves a moderate risk of morbidity and mortality. The differential diagnoses include gout, arthropthy, tendinitis. I decided to review old records, and in summary middle-aged male presenting with ongoing pain and swelling left foot/ankle.  I did not require additional historical information from anyone.    Critical Interventions-clinical evaluation, review of prior evaluations including imaging, discussion with patient  After These Interventions, the Patient was reevaluated and was found stable for discharge.  Patient's pain is mostly left heel with nontender swelling in the ankle.   Exact etiology is not clear.  No good clinical evidence for tendinitis and doubt fracture.  Will give additional treatment with prednisone and pain medicine along with splinting the foot and ankle joints using a cam walker.  He will be referred to podiatry for further evaluation and treatment  CRITICAL CARE-no Performed by: Mancel Bale  Nursing Notes Reviewed/ Care Coordinated Applicable Imaging Reviewed Interpretation of Laboratory Data incorporated into ED treatment  The patient appears reasonably screened and/or stabilized for discharge and I doubt any other medical condition or other [facility] requiring further screening, evaluation, or treatment in the ED  at this time prior to discharge.  Plan: Home Medications-continue usual; Home Treatments-CAM Walker for comfort; return here if the recommended treatment, does not improve the symptoms; Recommended follow up-podiatry follow-up for further evaluation and treatment     Final Clinical Impression(s) / ED Diagnoses Final diagnoses:  Gout, unspecified cause, unspecified chronicity, unspecified site    Rx / DC Orders ED Discharge Orders         Ordered    oxyCODONE-acetaminophen (PERCOCET) 5-325 MG tablet  Every 6 hours PRN        03/26/20 1246    predniSONE (DELTASONE) 10 MG tablet        03/26/20 1246           Mancel Bale, MD 03/26/20 1249

## 2020-03-26 NOTE — Discharge Instructions (Signed)
You most likely have gout causing your symptoms.  We are giving you a special boot to walk and to help your discomfort.  We sent prescriptions to your pharmacy.  Call the podiatry office for a follow-up appointment to be seen for further care and treatment as soon as possible.  Do not drive or work when taking the narcotic pain medicine.

## 2021-09-05 IMAGING — DX DG ANKLE 2V *L*
2 series · 2 of 2 positions shown · non-contrast
Comparison: None.

CLINICAL DATA: Pain and swelling, no known injury, initial
encounter

EXAM:
LEFT ANKLE - 2 VIEW

[ankle ap]
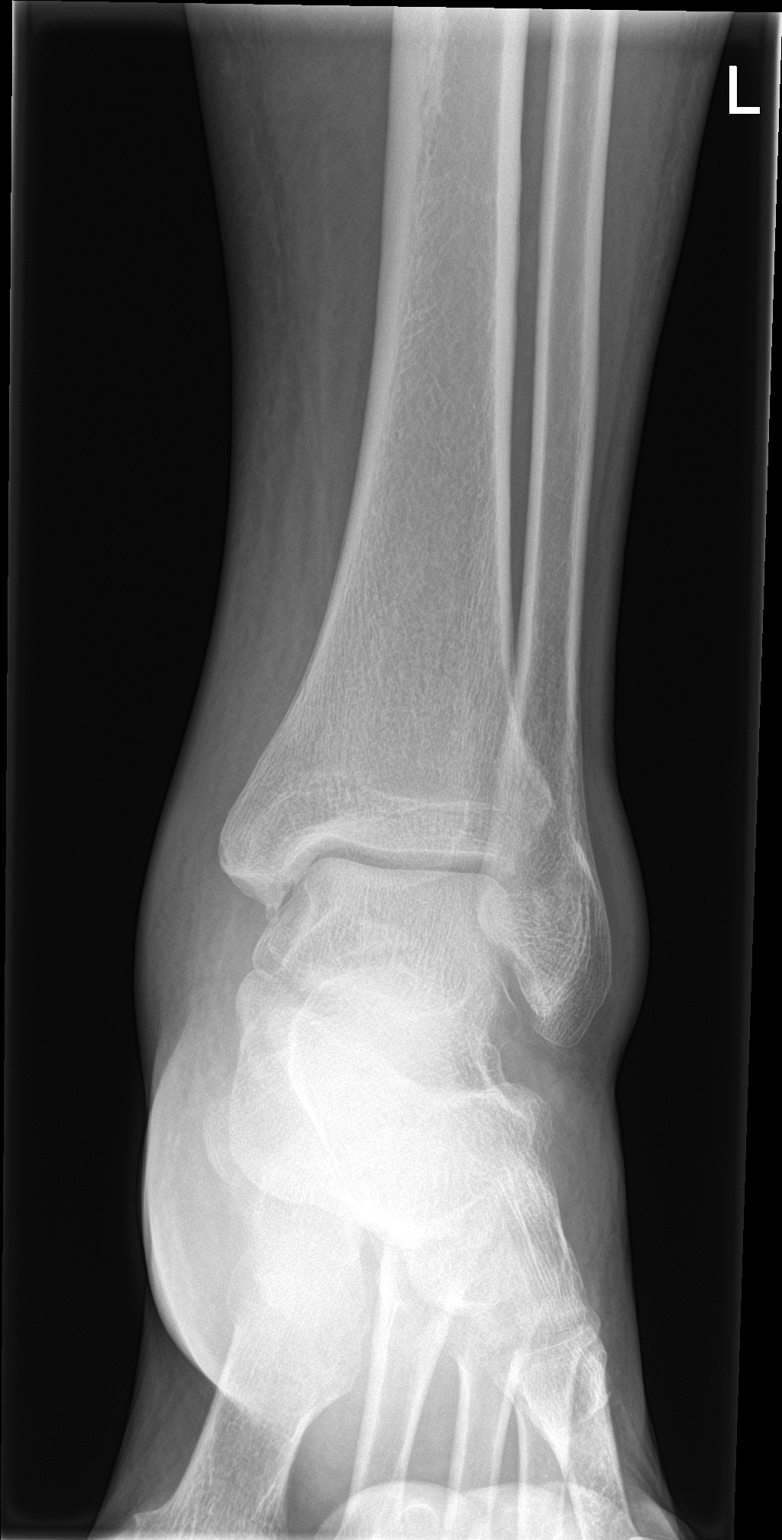

[ankle lat]
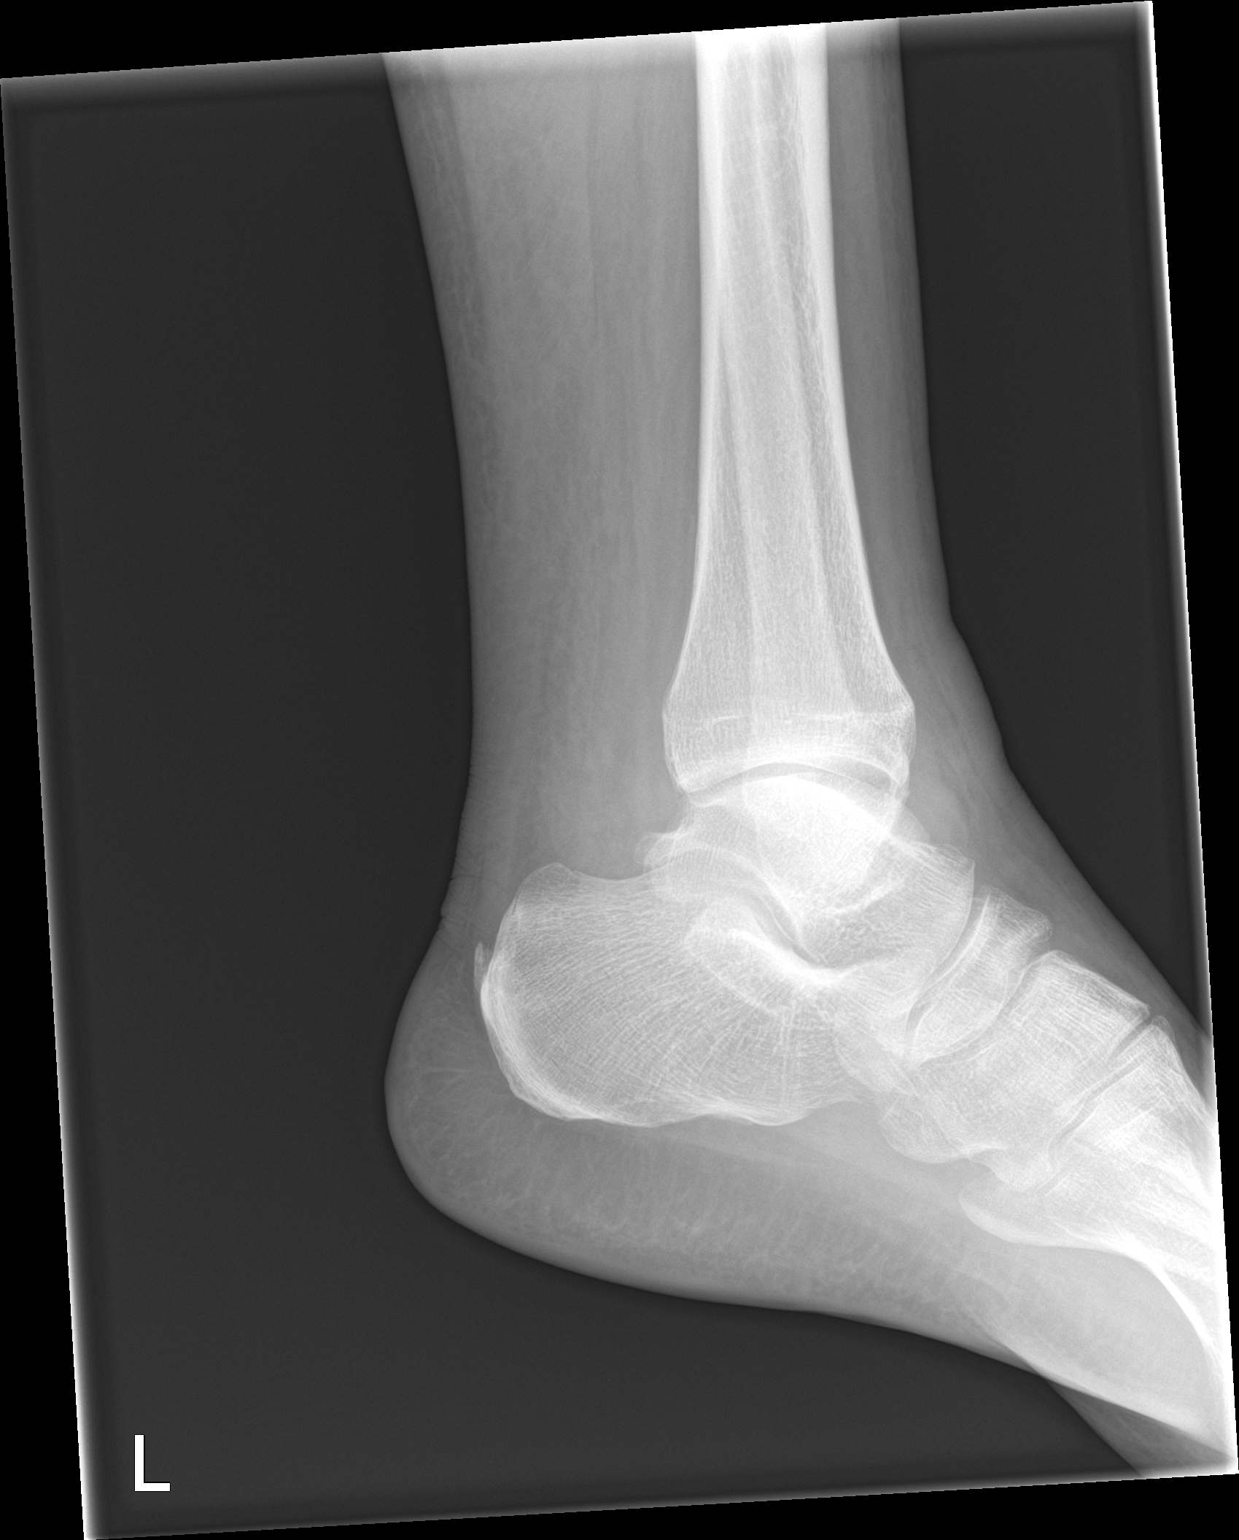

[2 of 2 positions shown; findings below may reference images not displayed]

FINDINGS: Mild generalized swelling is noted about the ankle joint. Small
calcaneal spurs are seen. No acute fracture or dislocation is
IMPRESSION: Mild soft tissue swelling without acute bony abnormality.
